# Patient Record
Sex: Male | Born: 1985 | Race: Black or African American | Hispanic: No | Marital: Married | Smoking: Never smoker
Health system: Southern US, Community
[De-identification: ages and names within clinical notes are randomized; demographics above are authoritative.]

## PROBLEM LIST (undated history)

## (undated) DIAGNOSIS — R569 Unspecified convulsions: Secondary | ICD-10-CM

## (undated) HISTORY — DX: Unspecified convulsions: R56.9

---

## 2019-02-04 ENCOUNTER — Other Ambulatory Visit: Payer: Self-pay

## 2019-02-04 ENCOUNTER — Emergency Department
Admission: EM | Admit: 2019-02-04 | Discharge: 2019-02-04 | Disposition: A | Discharge status: Home / Self Care | Payer: BC Managed Care – PPO | Attending: Emergency Medicine | Admitting: Emergency Medicine

## 2019-02-04 ENCOUNTER — Emergency Department: Payer: BC Managed Care – PPO

## 2019-02-04 ENCOUNTER — Encounter: Payer: Self-pay | Admitting: Emergency Medicine

## 2019-02-04 DIAGNOSIS — R569 Unspecified convulsions: Secondary | ICD-10-CM | POA: Diagnosis not present

## 2019-02-04 DIAGNOSIS — G919 Hydrocephalus, unspecified: Secondary | ICD-10-CM | POA: Insufficient documentation

## 2019-02-04 DIAGNOSIS — R404 Transient alteration of awareness: Secondary | ICD-10-CM | POA: Diagnosis present

## 2019-02-04 LAB — CBC
HCT: 43 % (ref 39.0–52.0)
Hemoglobin: 14.6 g/dL (ref 13.0–17.0)
MCH: 28.3 pg (ref 26.0–34.0)
MCHC: 34 g/dL (ref 30.0–36.0)
MCV: 83.5 fL (ref 80.0–100.0)
Platelets: 258 10*3/uL (ref 150–400)
RBC: 5.15 MIL/uL (ref 4.22–5.81)
RDW: 13 % (ref 11.5–15.5)
WBC: 9.6 10*3/uL (ref 4.0–10.5)
nRBC: 0 % (ref 0.0–0.2)

## 2019-02-04 LAB — BASIC METABOLIC PANEL
Anion gap: 8 (ref 5–15)
BUN: 13 mg/dL (ref 6–20)
CO2: 26 mmol/L (ref 22–32)
Calcium: 8.8 mg/dL — ABNORMAL LOW (ref 8.9–10.3)
Chloride: 105 mmol/L (ref 98–111)
Creatinine, Ser: 1.06 mg/dL (ref 0.61–1.24)
GFR calc Af Amer: >60 mL/min (ref >60)
GFR calc non Af Amer: >60 mL/min (ref >60)
Glucose, Bld: 167 mg/dL — ABNORMAL HIGH (ref 70–99)
Potassium: 2.9 mmol/L — ABNORMAL LOW (ref 3.5–5.1)
Sodium: 139 mmol/L (ref 135–145)

## 2019-02-04 LAB — TROPONIN I (HIGH SENSITIVITY): Troponin I (High Sensitivity): 10 ng/L (ref <18)

## 2019-02-04 MED ORDER — GADOBUTROL 1 MMOL/ML IV SOLN
10.0000 mL | Freq: Once | INTRAVENOUS | Status: AC | PRN
  Administered 2019-02-04: 07:00:00 10 mL via INTRAVENOUS

## 2019-02-04 MED ORDER — POTASSIUM CHLORIDE CRYS ER 20 MEQ PO TBCR
40.0000 meq | EXTENDED_RELEASE_TABLET | Freq: Once | ORAL | Status: AC
  Administered 2019-02-04: 08:00:00 40 meq via ORAL
  Filled 2019-02-04: qty 2

## 2019-02-04 MED ORDER — LEVETIRACETAM 500 MG PO TABS
500.0000 mg | ORAL_TABLET | Freq: Once | ORAL | Status: AC
  Administered 2019-02-04: 500 mg via ORAL
  Filled 2019-02-04: qty 1

## 2019-02-04 MED ORDER — LEVETIRACETAM 500 MG PO TABS: 500.0000 mg | ORAL_TABLET | Freq: Two times a day (BID) | ORAL | 1 refills | Status: DC

## 2019-02-04 NOTE — Consult Note (Signed)
Neurosurgery-New Consultation Evaluation 02/04/2019 Tracy Holmes [id number]  Identifying Statement: Tracy Holmes [place] 33 [place] [id number] with first seziure  Physician Requesting Consultation: [facility]  History of Present Illness: Mr. Tracy Holmes is here with likely seizure this morning. Per spouse, she noted he was dazed and staring with some blood on pillow from biting tongue. She saw some abnormal movements but denies shaking. He has returned to his baseline here and denies any history of seizure. He also denies any previous severe headaches, vision problems, speech problems. He has no history of neurologic disease and is otherwise healthy. He notes no changes this week that may incite a seizure. The ED got a CT and MRI of the head as part of the workup and have started him on an AED.   Past Medical History:  History reviewed. No pertinent past medical history.  Social History: Social History   Socioeconomic History  . Marital status: Married    Spouse name: Not on file  . Number of children: Not on file  . Years of education: Not on file  . Highest education level: Not on file  Occupational History  . Not on file  Social Needs  . Financial resource strain: Not on file  . Food insecurity    Worry: Not on file    Inability: Not on file  . Transportation needs    Medical: Not on file    Non-medical: Not on file  Tobacco Use  . Smoking status: Not on file  Substance and Sexual Activity  . Alcohol use: Not on file  . Drug use: Not on file  . Sexual activity: Not on file  Lifestyle  . Physical activity    Days per week: Not on file    Minutes per session: Not on file  . Stress: Not on file  Relationships  . Social Musicianconnections    Talks on phone: Not on file    Gets together: Not on file    Attends religious service: Not on file    Active member of club or organization: Not on file    Attends meetings of clubs or organizations: Not on file   Relationship status: Not on file  . Intimate partner violence    Fear of current or ex partner: Not on file    Emotionally abused: Not on file    Physically abused: Not on file    Forced sexual activity: Not on file  Other Topics Concern  . Not on file  Social History Narrative  . Not on file   Living arrangements (living alone, with partner): [place]  Family History: No family history on file.  Review of Systems:  Review of Systems - General ROS: Negative Psychological ROS: Negative Ophthalmic ROS: Negative ENT ROS: Negative Hematological and Lymphatic ROS: Negative  Endocrine ROS: Negative Respiratory ROS: Negative Cardiovascular ROS: Negative Gastrointestinal ROS: Negative Genito-Urinary ROS: Negative Musculoskeletal ROS: Negative Neurological ROS: Positive for seizure Dermatological ROS: Negative  Physical Exam: BP (!) 165/106 (BP Location: Right Arm)   Pulse 73   Resp 18   Ht 5\' 9"  (1.753 m)   Wt (!) 139.3 kg   SpO2 100%   BMI 45.34 kg/m  Body mass index is 45.34 kg/m. Body surface area is 2.6 meters squared. General appearance: Alert, cooperative, in no acute distress Head: Normocephalic, atraumatic Eyes: Normal, EOM intact Oropharynx: Moist without lesions Neck: Supple, Full ROM Ext: No edema in LE bilaterally  Neurologic exam:  Mental status: alertness: alert, orientation: person, place, time, affect: normal Speech: fluent and clear, naming and repetition intact Cranial nerves:  II: Visual fields are full by confrontation, no ptosis III/IV/VI: extra-ocular motions intact bilaterally V/VII:no evidence of facial droop or weakness  VIII: hearing normal XI: trapezius strength symmetric XII: tongue strength symmetric  Motor:strength symmetric 5/5, normal muscle mass and tone in all extremities and no pronator drift Sensory: intact to light touch in all extremities Gait: not tested  Laboratory: Results for orders placed or performed  during the hospital encounter of 02/04/19  Basic metabolic panel - if new onset seizures  Result Value Ref Range   Sodium 139 135 - 145 mmol/L   Potassium 2.9 (L) 3.5 - 5.1 mmol/L   Chloride 105 98 - 111 mmol/L   CO2 26 22 - 32 mmol/L   Glucose, Bld 167 (H) 70 - 99 mg/dL   BUN 13 6 - 20 mg/dL   Creatinine, Ser 8.14 0.61 - 1.24 mg/dL   Calcium 8.8 (L) 8.9 - 10.3 mg/dL   GFR calc non Af Amer >60 >60 mL/min   GFR calc Af Amer >60 >60 mL/min   Anion gap 8 5 - 15  CBC - if new onset seizures  Result Value Ref Range   WBC 9.6 4.0 - 10.5 K/uL   RBC 5.15 4.22 - 5.81 MIL/uL   Hemoglobin 14.6 13.0 - 17.0 g/dL   HCT 48.1 85.6 - 31.4 %   MCV 83.5 80.0 - 100.0 fL   MCH 28.3 26.0 - 34.0 pg   MCHC 34.0 30.0 - 36.0 g/dL   RDW 97.0 26.3 - 78.5 %   Platelets 258 150 - 400 K/uL   nRBC 0.0 0.0 - 0.2 %  Troponin I (High Sensitivity)  Result Value Ref Range   Troponin I (High Sensitivity) 10 <18 ng/L   I personally reviewed labs  Imaging: Results for orders placed during the hospital encounter of 02/04/19  MR Brain W and Wo Contrast   Narrative CLINICAL DATA:  Seizure, new, nontraumatic  EXAM: MRI HEAD WITHOUT AND WITH CONTRAST  TECHNIQUE: Multiplanar, multiecho pulse sequences of the brain and surrounding structures were obtained without and with intravenous contrast.  CONTRAST:  41mL GADAVIST GADOBUTROL 1 MMOL/ML IV SOLN  COMPARISON:  None.  FINDINGS: Brain: Lateral ventriculomegaly with ballooning. Third ventricular enlargement with dilated inferior recesses that project downward. The fourth ventricle is normal caliber and there is a highly stenotic appearance of the lower cerebral aqua duct, imperceptible on a few axial thin T1 slices. No underlying mass or swelling is noted. No signs of previous intracranial hemorrhage or infection. Partially empty sella appearance with a few internal thin septations, of indeterminate significance in presumably from altered CSF flow. There is  trace periventricular FLAIR hyperintensity around the lateral ventricles presumably reactive to the hydrocephalus. No focal cortical finding  Vascular: Normal flow voids and vascular enhancements.  Skull and upper cervical spine: Normal marrow signal  Sinuses/Orbits: Negative  IMPRESSION: 1. 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 2. History of seizure with no focal cortical finding.   Electronically Signed   By: Marnee Spring M.D.   On: 02/04/2019 06:56    I personally reviewed radiology studies with the patient   Impression/Plan:  Mr. 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 I do not see an obvious correlation to his seizure and given that he is  asymptomatic at this time, we discussed options of close follow up vs consideration of CSF diversion. I dont see an urgent role for intervention and we discussed that this could have been there since birth. I do think we will need follow up MRI in time to ensure there is no underlying lesion but T2 and contrasted images today show no offending lesion causing any compression. He agrees with plan for close follow up.    1.  Diagnosis: Hydrocephalus  2.  Plan - Follow up in clinic in 3-4 weeks.

## 2019-02-04 NOTE — ED Triage Notes (Signed)
Pt's spouse states she woke up to pt possibly having a seizure. Pt did bite his tongue, spouse states pt was face down in pillow, "gasping" for air and jerking per spouse. Incident occurred around 0300. Pt states all he remembers is he woke up to paramedics at bedside.

## 2019-02-04 NOTE — ED Notes (Signed)
Report tom, rn. siderails up, spouse at bedside.

## 2019-02-04 NOTE — ED Provider Notes (Signed)
Assumed care from Dr. Charna Archer at 7 AM. Briefly, the patient is a 33 y.o. male with PMHx of  has no past medical history on file. here with new onset seizure. 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 NSGY consulted, unlikely etiology for seizures but will see pt this AM in ED. Plan to d/w Neuro re: meds, f/u, and likely discharge.   Labs Reviewed  BASIC METABOLIC PANEL - Abnormal; Notable for the following components:      Result Value   Potassium 2.9 (*)    Glucose, Bld 167 (*)    Calcium 8.8 (*)    All other components within normal limits  CBC  TROPONIN I (HIGH SENSITIVITY)    Course of Care: -Discussed with Dr. Doy Mince. Will start on Keppra 500 BID and refer for outpt EEG. First dose given here. NSGY will see in ED and f/u as outpatient. Pt updated, in agreement. No seizure like activity here.     Duffy Bruce, MD 02/04/19 0930

## 2019-02-04 NOTE — ED Provider Notes (Signed)
[facility] Emergency Department Provider Note   ____________________________________________   First MD Initiated Contact with Patient 02/04/19 [phone]     (approximate)  I have reviewed the triage vital signs and the nursing notes.   HISTORY  Chief Complaint possible seizure    HPI Tracy Holmes is a 33 y.o. male with no significant past medical history who presents to the ED following possible seizure.  Majority of history of is obtained from wife, who states that she was woken from sleep to find the patient face down and actively shaking in bed.  She states that he was unresponsive and continued shaking for 1 to 2 minutes.  Afterwards, he opened his eyes but would only stare blankly at her and seemed very confused.  He remained nonverbal until the point where EMS arrived.  Patient states he does not remember anything happening, but woke up to EMS being present.  He does not have any history of seizures and denies any cardiac history.  He has been feeling well recently with no fevers, chills, cough, chest pain, shortness of breath.  He does not take any regular medications and denies any alcohol or drug abuse.  He currently feels well with no complaints.        History reviewed. No pertinent past medical history.  There are no active problems to display for this patient.   History reviewed. No pertinent surgical history.  Prior to Admission medications   Not on File    Allergies Patient has no known allergies.  No family history on file.  Social History Social History   Tobacco Use  . Smoking status: Not on file  Substance Use Topics  . Alcohol use: Not on file  . Drug use: Not on file    Review of Systems  Constitutional: No fever/chills Eyes: No visual changes. ENT: No sore throat. Cardiovascular: Denies chest pain. Respiratory: Denies shortness of breath. Gastrointestinal: No abdominal pain.  No nausea, no vomiting.  No diarrhea.   No constipation. Genitourinary: Negative for dysuria. Musculoskeletal: Negative for back pain. Skin: Negative for rash. Neurological: Negative for headaches, focal weakness or numbness.  Positive for possible seizure.  ____________________________________________   PHYSICAL EXAM:  VITAL SIGNS: ED Triage Vitals  Enc Vitals Group     BP 02/04/19 0400 (!) 160/91     Pulse Rate 02/04/19 0400 (!) 184     Resp 02/04/19 0403 18     Temp --      Temp Source 02/04/19 0403 Oral     SpO2 02/04/19 0400 98 %     Weight 02/04/19 0404 (!) 307 lb (139.3 kg)     Height 02/04/19 0404 5\' 9"  (1.753 m)     Head Circumference --      Peak Flow --      Pain Score 02/04/19 0404 0     Pain Loc --      Pain Edu? --      Excl. in GC? --     Constitutional: Alert and oriented. Eyes: Conjunctivae are normal.  Pupils equal round and reactive to light bilaterally, extra ocular movements intact with no nystagmus. Head: Atraumatic. Nose: No congestion/rhinnorhea. Mouth/Throat: Mucous membranes are moist.  Small right lateral tongue laceration. Neck: Normal ROM Cardiovascular: Normal rate, regular rhythm. Grossly normal heart sounds. Respiratory: Normal respiratory effort.  No retractions. Lungs CTAB. Gastrointestinal: Soft and nontender. No distention. Genitourinary: deferred Musculoskeletal: No lower extremity tenderness nor edema. Neurologic:  Normal speech and language. No  gross focal neurologic deficits are appreciated.  5 out of 5 strength in bilateral upper and lower extremities, no pronator drift. Skin:  Skin is warm, dry and intact. No rash noted. Psychiatric: Mood and affect are normal. Speech and behavior are normal.  ____________________________________________   LABS (all labs ordered are listed, but only abnormal results are displayed)  Labs Reviewed  BASIC METABOLIC PANEL - Abnormal; Notable for the following components:      Result Value   Potassium 2.9 (*)    Glucose, Bld 167 (*)     Calcium 8.8 (*)    All other components within normal limits  CBC  TROPONIN I (HIGH SENSITIVITY)  TROPONIN I (HIGH SENSITIVITY)   ____________________________________________  EKG  ED ECG REPORT I, Blake Divine, the attending physician, personally viewed and interpreted this ECG.   Date: 02/04/2019  EKG Time: 5:13  Rate: 96  Rhythm: normal sinus rhythm  Axis: Normal  Intervals:none  ST&T Change: Nonspecific T wave changes inferiorly    PROCEDURES  Procedure(s) performed (including Critical Care):  Procedures   ____________________________________________   INITIAL IMPRESSION / ASSESSMENT AND PLAN / ED COURSE       33 year old male episode where wife found him face down and shaking in bed seizure appears to be the most likely explanation for this episode, especially given the described postictal state as well as small tongue laceration.  Will check EKG and troponin, but overall low suspicion for cardiac etiology.  Patient denies any alcohol or drug abuse that would contribute to seizure activity, does not take any medications.  Will screen head CT, CBC, BMP.  Otherwise plan on holding off on anticonvulsants, will need to follow-up with neurology.  CT head significant for ventriculomegaly above the level of the cerebral aqueduct.  Will obtain MRI brain with and without contrast to further assess for CSF obstruction.  Patient denies any history of chronic headaches or other neurologic symptoms.  MRI brain with and without contrast shows stenosis at cerebral aqueduct.  Case was discussed with Dr. Lacinda Axon of neurosurgery, who will plan to evaluate the patient this morning however hydrocephalus thought to be unlikely to be contributing to patient's seizure-like episode.      ____________________________________________   FINAL CLINICAL IMPRESSION(S) / ED DIAGNOSES  Final diagnoses:  Seizure ([place])  Hydrocephalus, unspecified type [facility])     ED Discharge Orders     None       Note:  This document was prepared using Dragon voice recognition software and may include unintentional dictation errors.   Blake Divine, MD 02/04/19 [phone]

## 2019-02-04 NOTE — ED Notes (Signed)
Pt verbalized understanding of discharge instructions. NAD at this time. 

## 2019-02-04 NOTE — ED Notes (Signed)
184 hr entered in error.

## 2019-02-04 NOTE — Discharge Instructions (Addendum)
Start taking the Keppra twice a day  You have been seen in the emergency department today for a likely seizure.  Your workup today including labs are within normal limits.  Please follow up with your doctor as soon as possible regarding today's emergency department visit and your likely seizure.  You will also need to follow up with a neurologist as soon as possible, please call for appointment.  If you have been prescribed a medication for your seizures, please take this medication as prescribed.  As we have discussed it is very important that you DO NOT drive until you have been seen and cleared by your neurologist.  Please drink plenty of fluids, get plenty of sleep and avoid any alcohol or drug use.  Return to the emergency department if you have any further seizures, develop any weakness/numbness of any arm/leg, confusion, slurred speech, or sudden/severe headache.

## 2019-02-04 NOTE — ED Notes (Signed)
Blue, Red, LT Si [place]ider, and [place] sent to lab.

## 2019-02-28 ENCOUNTER — Encounter: Payer: Self-pay | Admitting: Neurology

## 2019-02-28 ENCOUNTER — Other Ambulatory Visit: Payer: Self-pay

## 2019-02-28 ENCOUNTER — Telehealth (INDEPENDENT_AMBULATORY_CARE_PROVIDER_SITE_OTHER): Payer: BC Managed Care – PPO | Admitting: Neurology

## 2019-02-28 VITALS — Ht 69.0 in | Wt 300.0 lb

## 2019-02-28 DIAGNOSIS — Q030 Malformations of aqueduct of Sylvius: Secondary | ICD-10-CM

## 2019-02-28 DIAGNOSIS — R569 Unspecified convulsions: Secondary | ICD-10-CM | POA: Diagnosis not present

## 2019-02-28 DIAGNOSIS — G40909 Epilepsy, unspecified, not intractable, without status epilepticus: Secondary | ICD-10-CM | POA: Insufficient documentation

## 2019-02-28 MED ORDER — LEVETIRACETAM 500 MG PO TABS: 500.0000 mg | ORAL_TABLET | Freq: Two times a day (BID) | ORAL | 11 refills | Status: DC

## 2019-02-28 NOTE — Progress Notes (Signed)
Virtual Visit via Video Note The purpose of this virtual visit is to provide medical care while limiting exposure to the novel coronavirus.    Consent was obtained for video visit:  Yes.   Answered questions that patient had about telehealth interaction:  Yes.   I discussed the limitations, risks, security and privacy concerns of performing an evaluation and management service by telemedicine. I also discussed with the patient that there may be a patient responsible charge related to this service. The patient expressed understanding and agreed to proceed.  Pt location: Home Physician Location: office Name of referring provider:  Blake Divine, MD I connected with Tracy Holmes at patients initiation/request on 02/28/2019 at  9:00 AM EST by video enabled telemedicine application and verified that I am speaking with the correct person using two identifiers. Pt MRN:  [id number] Pt DOB:  19-May-1985 Video Participants:  Tracy Holmes   History of Present Illness:  This is a very pleasant 33 year old right-handed man with no significant past medical history presenting for evaluation of seizure-like activity last 02/04/2019. He is amnestic of the event, waking up to EMS in his house. His right arm felt sore but no focal weakness. He reports that his wife woke up because she heard him gasping for air. He was face down on the pillow, he was not responding, so she moved his head and found him with eyes open with a blank stare, repeatedly gasping for air. She was not sure if there was body jerking, she had reported shaking to EMS/ER, but husband states she is now unsure. He bit the right side of his tongue, no incontinence. He was brought to [facility] where bloodwork showed a K of 2.9, otherwise unremarkable. I personally reviewed MRI brain with and without contrast which did not show any acute changes. There was lateral ventriculomegaly with ballooning, third ventricular enlargement with dilated inferior recesses  projecting downward, highly stenotic appearance of lower cerebral aqueduct. No underlying mass. 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 There was trace periventricular FLAIR signal around the lateral ventricles presumably reactive to the hydrocephalus. Hippocampi symmetric with no abnormal cortical changes seen. 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 Due to abnormal brain scan, he was started on Levetiracetam 500mg  BID. He denies any side effects. He denies any prior history of seizures. He denies any staring/unresponsive episodes, gaps in time, olfactory/gustatory hallucinations, deja vu, rising epigastric sensation, focal numbness/tingling/weakness, myoclonic jerks. He denies any headaches, dizziness, vision changes, neck pain, bowel/bladder dysfunction. He has scoliosis with a pinched nerve and back pain, which occurred a week prior to the seizure. He does not drink alcohol. He does not feel under stress, but has had a lot going on, waking up a few times with his mind racing. He was recently married with a new house and a baby on the way in June. 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  Epilepsy Risk Factors: His father had 1 or 2 seizures at age 78. Otherwise he had a normal birth and early development.  There is no history of febrile convulsions, CNS infections such as meningitis/encephalitis, significant traumatic brain injury, neurosurgical procedures.   PAST MEDICAL HISTORY: Past Medical History:  Diagnosis Date  . Seizure ([place])     PAST SURGICAL HISTORY: No past surgical history on file.  MEDICATIONS: Current Outpatient Medications on File Prior to Visit  Medication Sig Dispense Refill  . levETIRAcetam (KEPPRA) 500 MG tablet Take  1 tablet (500 mg total) by mouth 2 (two) times daily. 60 tablet 1   No current facility-administered medications on file prior to visit.     ALLERGIES: No  Known Allergies  FAMILY HISTORY: Family History  Problem Relation Age of Onset  . Diabetes Mother   . Heart disease Father   . Breast cancer Maternal Grandmother       Current Outpatient Medications on File Prior to Visit  Medication Sig Dispense Refill  . levETIRAcetam (KEPPRA) 500 MG tablet Take 1 tablet (500 mg total) by mouth 2 (two) times daily. 60 tablet 1   No current facility-administered medications on file prior to visit.      Observations/Objective:   Vitals:   02/28/19 0828  Weight: 300 lb (136.1 kg)  Height: 5\' 9"  (1.753 m)   GEN:  The patient appears stated age and is in NAD.  Neurological examination: Patient is awake, alert, oriented x 3. No aphasia or dysarthria. Intact fluency and comprehension. Remote and recent memory intact. Able to name and repeat. Cranial nerves: Extraocular movements intact with no nystagmus. No facial asymmetry. Motor: moves all extremities symmetrically, at least anti-gravity x 4. No incoordination on finger to nose testing. Gait: narrow-based and steady, able to tandem walk adequately. Negative Romberg test.  Assessment and Plan:   This is a pleasant 33 year old right-handed man with no significant past medical history presenting for evaluation of an episode last 02/04/2019 concerning for a nocturnal seizure. 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 Seizures have been described in the setting of hydrocephalus with aqueductal stenosis. A 1-hour EEG will be ordered for seizure classification. We discussed continuation of Levetiracetam 500mg  BID, refills sent. We discussed avoidance of seizure triggers, seizures restrictions, including no driving until 6 months seizure-free. He will follow-up in 3 months and knows to call for any changes.    Follow Up Instructions:   -I discussed the assessment and treatment plan with the patient. The patient was provided an  opportunity to ask questions and all were answered. The patient agreed with the plan and demonstrated an understanding of the instructions.   The patient was advised to call back or seek an in-person evaluation if the symptoms worsen or if the condition fails to improve as anticipated.   02/06/2019, MD

## 2019-03-15 ENCOUNTER — Other Ambulatory Visit: Payer: BC Managed Care – PPO

## 2019-05-20 ENCOUNTER — Encounter: Payer: Self-pay | Admitting: Neurology

## 2019-05-20 ENCOUNTER — Telehealth (INDEPENDENT_AMBULATORY_CARE_PROVIDER_SITE_OTHER): Payer: BC Managed Care – PPO | Admitting: Neurology

## 2019-05-20 ENCOUNTER — Other Ambulatory Visit: Payer: Self-pay

## 2019-05-20 VITALS — Ht 70.0 in | Wt 307.0 lb

## 2019-05-20 DIAGNOSIS — R569 Unspecified convulsions: Secondary | ICD-10-CM

## 2019-05-20 DIAGNOSIS — Q030 Malformations of aqueduct of Sylvius: Secondary | ICD-10-CM

## 2019-05-20 NOTE — Progress Notes (Signed)
Virtual Visit via Video Note The purpose of this virtual visit is to provide medical care while limiting exposure to the novel coronavirus.    Consent was obtained for video visit:  Yes.   Answered questions that patient had about telehealth interaction:  Yes.   I discussed the limitations, risks, security and privacy concerns of performing an evaluation and management service by telemedicine. I also discussed with the patient that there may be a patient responsible charge related to this service. The patient expressed understanding and agreed to proceed.  Pt location: Home Physician Location: office Name of referring provider:  No ref. provider found I connected with Tracy Holmes at patients initiation/request on 05/20/2019 at 11:00 AM EST by video enabled telemedicine application and verified that I am speaking with the correct person using two identifiers. Pt MRN:  [id number] Pt DOB:  04-01-1986 Video Participants:  Tracy Holmes   History of Present Illness:  The patient was seen as a virtual video visit on 05/20/2019. He was last seen 3 months ago for new onset nocturnal seizure that occurred on 02/04/2019. MRI brain did not show any acute changes. There was lateral ventriculomegaly with ballooning, third ventricular enlargement with dilated inferior recesses projecting downward, highly stenotic appearance of lower cerebral aqueduct. No underlying mass. 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 There was trace periventricular FLAIR signal around the lateral ventricles presumably reactive to the hydrocephalus. Hippocampi symmetric with no abnormal cortical changes seen. 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 He has not yet done the EEG. He was discharged on Levetiracetam 500mg  BID which he is taking without side effects, no further seizures since 01/2019. He denies any staring/unresponsive episodes, gaps in time, olfactory/gustatory  hallucinations, focal numbness/tingling/weakness, myoclonic jerks. No significant headaches, he had a tension headache yesterday that resolved with sinus medication. No dizziness, vision changes, no falls. Sleep and mood are good. His son's due date is on June 3.   History on Initial Assessment 02/28/2019: This is a very pleasant 34 year old right-handed man with no significant past medical history presenting for evaluation of seizure-like activity last 02/04/2019. He is amnestic of the event, waking up to EMS in his house. His right arm felt sore but no focal weakness. He reports that his wife woke up because she heard him gasping for air. He was face down on the pillow, he was not responding, so she moved his head and found him with eyes open with a blank stare, repeatedly gasping for air. She was not sure if there was body jerking, she had reported shaking to EMS/ER, but husband states she is now unsure. He bit the right side of his tongue, no incontinence. He was brought to [facility] where bloodwork showed a K of 2.9, otherwise unremarkable. I personally reviewed MRI brain with and without contrast which did not show any acute changes. There was lateral ventriculomegaly with ballooning, third ventricular enlargement with dilated inferior recesses projecting downward, highly stenotic appearance of lower cerebral aqueduct. No underlying mass. 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 There was trace periventricular FLAIR signal around the lateral ventricles presumably reactive to the hydrocephalus. Hippocampi symmetric with no abnormal cortical changes seen. 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 Due to abnormal brain scan, he was started on Levetiracetam 500mg  BID. He denies any side effects. He denies any prior history of seizures. He denies any staring/unresponsive episodes, gaps in  time, olfactory/gustatory  hallucinations, deja vu, rising epigastric sensation, focal numbness/tingling/weakness, myoclonic jerks. He denies any headaches, dizziness, vision changes, neck pain, bowel/bladder dysfunction. He has scoliosis with a pinched nerve and back pain, which occurred a week prior to the seizure. He does not drink alcohol. He does not feel under stress, but has had a lot going on, waking up a few times with his mind racing. He was recently married with a new house and a baby on the way in June. 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  Epilepsy Risk Factors: His father had 1 or 2 seizures at age 100. Otherwise he had a normal birth and early development.  There is no history of febrile convulsions, CNS infections such as meningitis/encephalitis, significant traumatic brain injury, neurosurgical procedures.     Current Outpatient Medications on File Prior to Visit  Medication Sig Dispense Refill  . levETIRAcetam (KEPPRA) 500 MG tablet Take 1 tablet (500 mg total) by mouth 2 (two) times daily. 60 tablet 11   No current facility-administered medications on file prior to visit.     Observations/Objective:   Vitals:   05/20/19 1025  Weight: (!) 307 lb (139.3 kg)  Height: 5\' 10"  (1.778 m)   GEN:  The patient appears stated age and is in NAD.  Neurological examination: Patient is awake, alert, oriented x 3. No aphasia or dysarthria. Intact fluency and comprehension. Remote and recent memory intact. Able to name and repeat. Cranial nerves: Extraocular movements intact with no nystagmus. No facial asymmetry. Motor: moves all extremities symmetrically, at least anti-gravity x 4. No incoordination on finger to nose testing. Gait: narrow-based and steady, able to tandem walk adequately. Negative   Assessment and Plan:   This is a pleasant 34 yo RH man with no significant past medical history, who had an episode last 02/04/2019 concerning for a nocturnal seizure. 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 Seizures have been described in the setting of hydrocephalus with aqueductal stenosis. Proceed with 1-hour EEG as discussed. We again discussed continuation of Levetiracetam 500mg  BID. He is aware of [place] driving laws to stop driving until 6 months seizure-free. Follow-up in 5-6 months, he knows to call for any changes.   Follow Up Instructions:   -I discussed the assessment and treatment plan with the patient. The patient was provided an opportunity to ask questions and all were answered. The patient agreed with the plan and demonstrated an understanding of the instructions.   The patient was advised to call back or seek an in-person evaluation if the symptoms worsen or if the condition fails to improve as anticipated.     Cameron Sprang, MD

## 2019-10-18 ENCOUNTER — Ambulatory Visit: Payer: BC Managed Care – PPO | Admitting: Neurology

## 2020-03-27 ENCOUNTER — Other Ambulatory Visit: Payer: Self-pay | Admitting: Neurology

## 2020-03-27 DIAGNOSIS — R569 Unspecified convulsions: Secondary | ICD-10-CM

## 2020-05-03 ENCOUNTER — Other Ambulatory Visit: Payer: Self-pay | Admitting: Neurology

## 2020-05-03 DIAGNOSIS — R569 Unspecified convulsions: Secondary | ICD-10-CM

## 2020-05-07 ENCOUNTER — Telehealth: Payer: Self-pay | Admitting: Neurology

## 2020-05-07 DIAGNOSIS — R569 Unspecified convulsions: Secondary | ICD-10-CM

## 2020-05-07 NOTE — Telephone Encounter (Signed)
Spoke with Tracy Holmes informed him to start taking Keppra twice a day as prescribed. Also, him and Dr Karel Jarvis had talked about doing an EEG, I will get the front to call and have him schedule eeg and a f/u with Dr Karel Jarvis

## 2020-05-07 NOTE — Telephone Encounter (Signed)
Pt c/o: seizure Missed medications?  Yes.   only taken it once a day not twice  Sleep deprived?  No. Alcohol intake?  No. Back to their usual baseline self?  Yes.  . If no, advise go to ER Any increase stress? No increase in stress, stated has had less stress Current medications prescribed by Dr. Karel Jarvis: Keppra 500 mg has only been taken once a day stated going to start taken it back BID  Has a pinch nerve in his back, has a cream that he puts on his back unsure of the name,

## 2020-05-07 NOTE — Telephone Encounter (Signed)
Agree, he will need to start taking it twice a day as prescribed. Also, we had talked about doing an EEG, pls have him schedule it (and re-order if needed). Pls have him schedule a f/u with me, thanks

## 2020-05-07 NOTE — Addendum Note (Signed)
Addended by: Dimas Chyle on: 05/07/2020 01:16 PM   Modules accepted: Orders

## 2020-06-12 ENCOUNTER — Other Ambulatory Visit: Payer: Self-pay | Admitting: Neurology

## 2020-06-12 DIAGNOSIS — R569 Unspecified convulsions: Secondary | ICD-10-CM

## 2020-06-19 ENCOUNTER — Telehealth: Payer: Self-pay | Admitting: Neurology

## 2020-06-19 DIAGNOSIS — R569 Unspecified convulsions: Secondary | ICD-10-CM

## 2020-06-19 MED ORDER — LEVETIRACETAM 500 MG PO TABS: 500.0000 mg | ORAL_TABLET | Freq: Two times a day (BID) | ORAL | 3 refills | Status: DC

## 2020-06-19 NOTE — Telephone Encounter (Signed)
Rx(s) sent to pharmacy electronically.  Left message informing patient that his rx has been sent to the pharmacy. Advised a call back with any questions or concerns.

## 2020-06-19 NOTE — Telephone Encounter (Signed)
Patient called in stating he needs a refill on his levetiracetam. His next scheduled appointment is 02/26/21.

## 2020-07-07 IMAGING — CT CT HEAD W/O CM
3 of 4 series · 15 of 47 positions shown, 18 images · non-contrast
Comparison: None.

CLINICAL DATA: Seizure, new, nontraumatic

EXAM:
CT HEAD WITHOUT CONTRAST
TECHNIQUE: Contiguous axial images were obtained from the base of the skull
through the vertex without intravenous contrast.

[Series 2: head wo · axial · 0.45mm/px · z∈[-117,+8]mm · 9 of 31 slices shown, 12 images]
[im 3/31  brain]
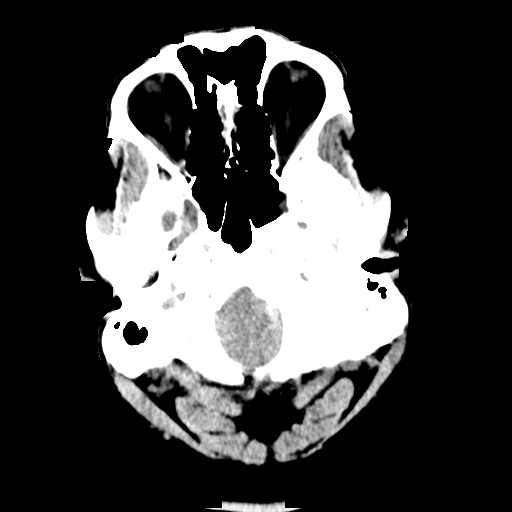
[im 3/31  bone]
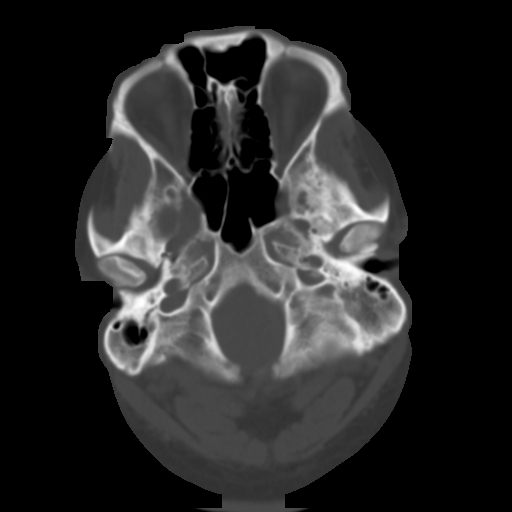
[im 7/31  brain]
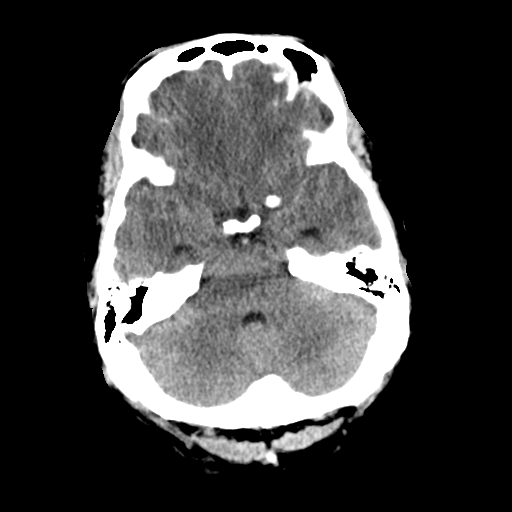
[im 9/31  brain]
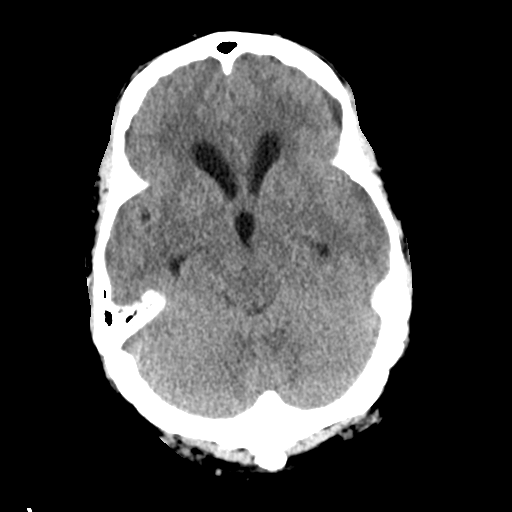
[im 13/31  brain]
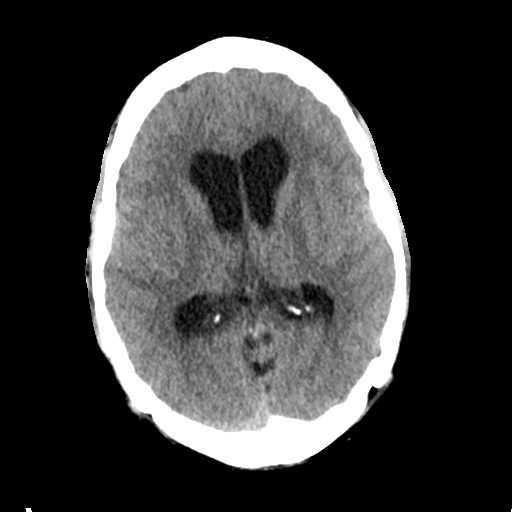
[im 16/31  brain]
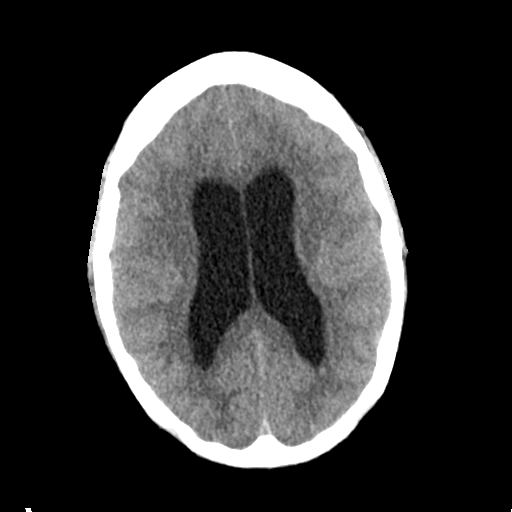
[im 16/31  bone]
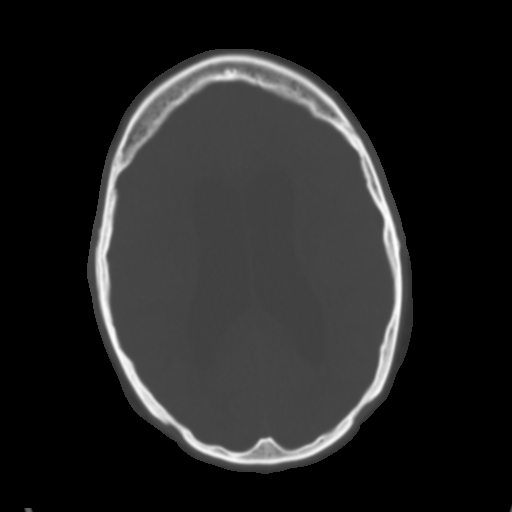
[im 18/31  brain]
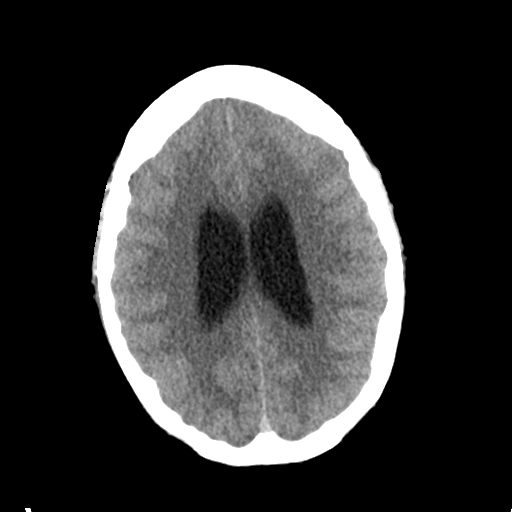
[im 22/31  brain]
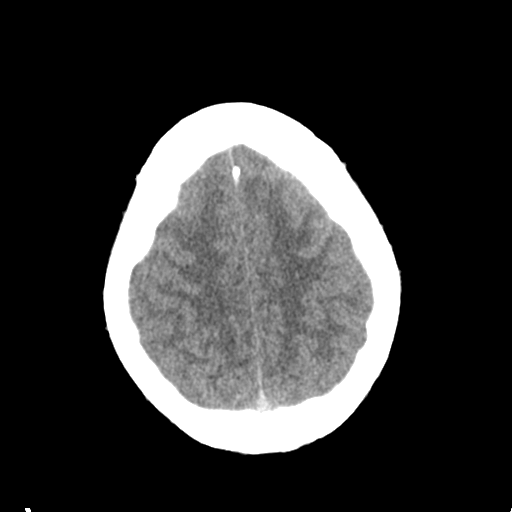
[im 24/31  brain]
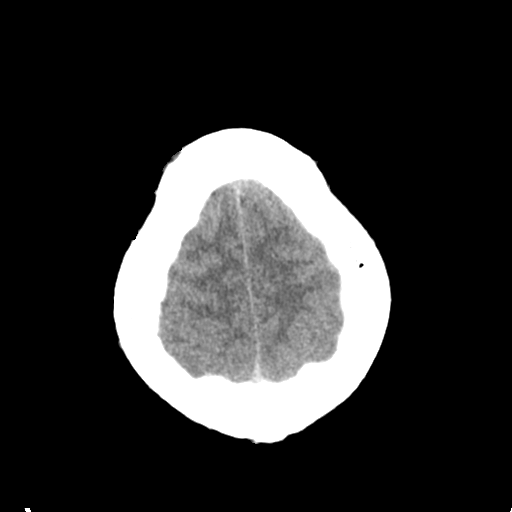
[im 28/31  brain]
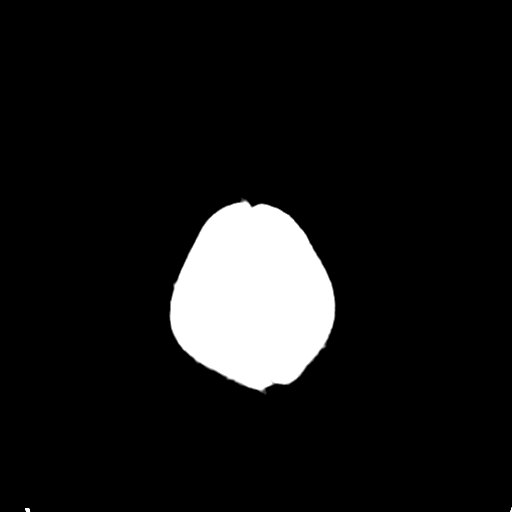
[im 28/31  bone]
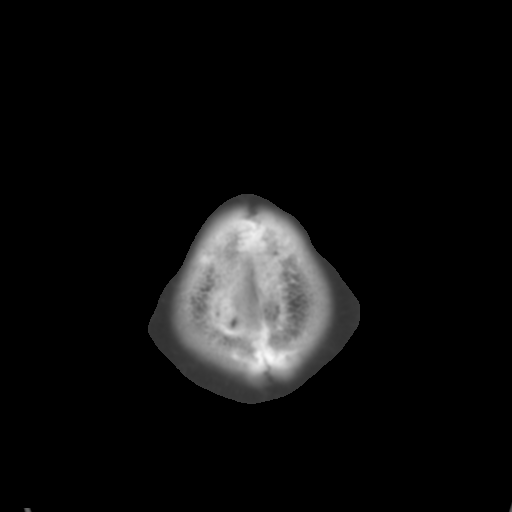

[Series 5: coronal soft tissue · coronal · 0.31mm/px · 3 of 68 slices shown]
[im 23/68  brain]
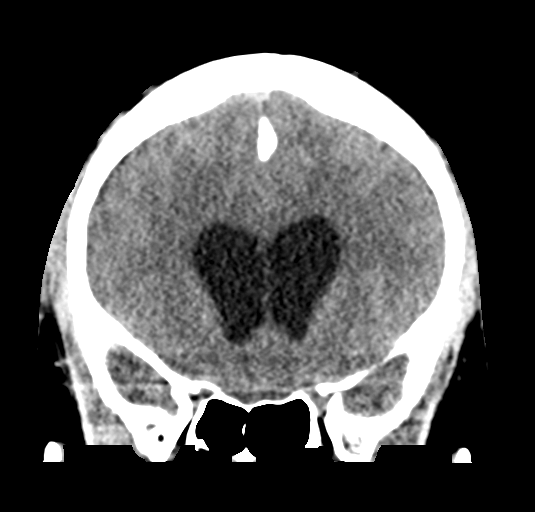
[im 30/68  brain]
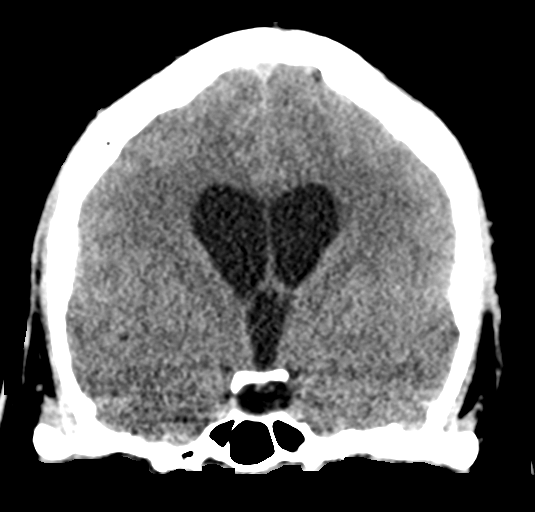
[im 38/68  brain]
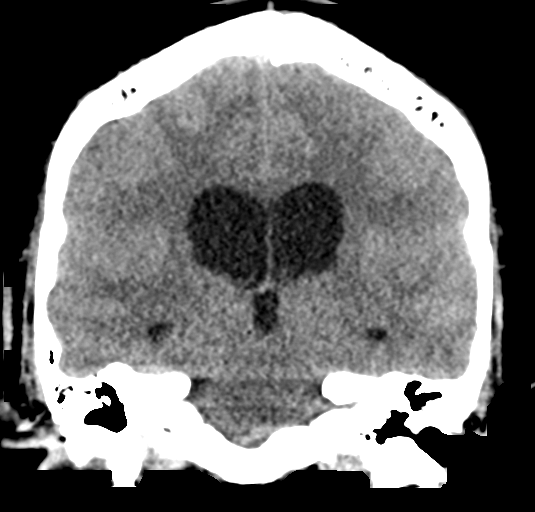

[Series 6: sagittal soft tissue · sagittal · 0.29mm/px · 3 of 51 slices shown]
[im 17/51  brain]
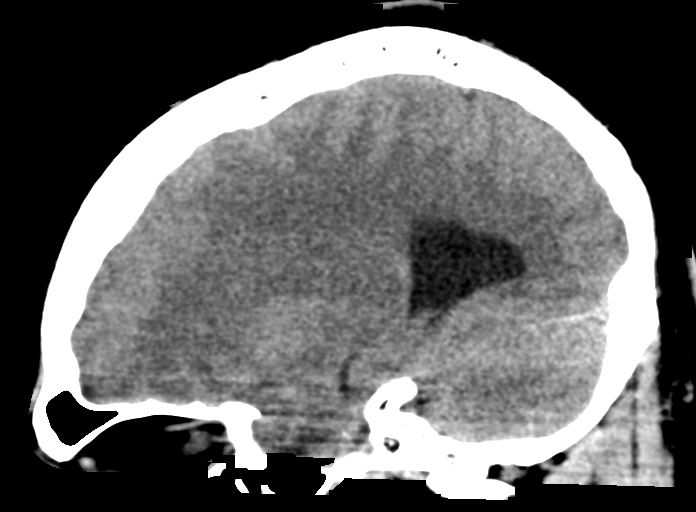
[im 26/51  brain]
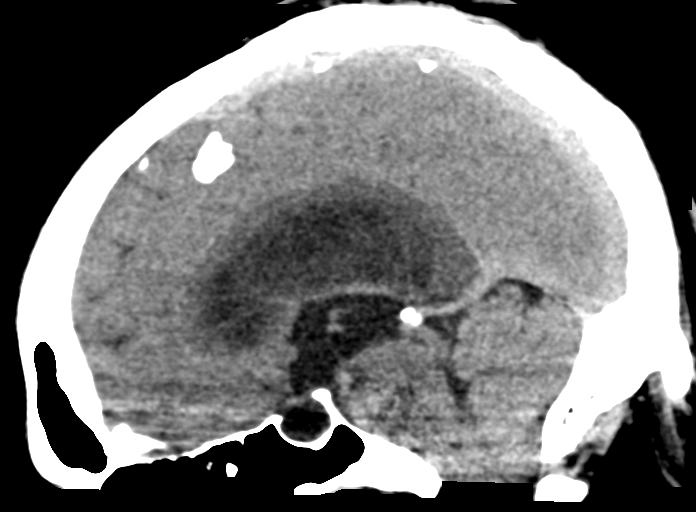
[im 34/51  brain]
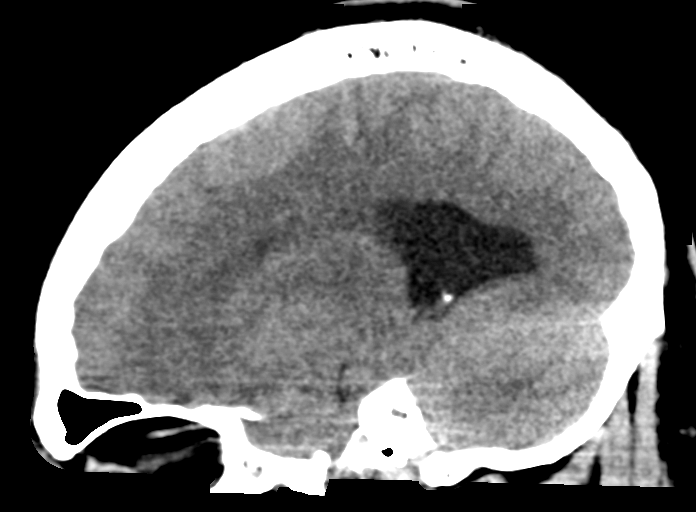

[15 of 47 positions shown; findings below may reference images not displayed]

FINDINGS: Brain: Enlarged lateral ventricles with ballooning although only
mild temporal horn dilatation. The third ventricle is also enlarged
with depression of inferior recesses on sagittal reformats. Fourth
ventricle is normal size. No visible obstructive process seen at the
level of the brainstem. Low cerebellar tonsils by 3 mm with no
pointing typical of Chiari malformation. No evidence of underlying
infarct, hemorrhage, mass, or extra-axial collection. No evidence of
transependymal CSF flow. Reportedly patient has no baseline
headaches and findings could be developmental and compensated.

Vascular: Negative

Skull: Negative

Sinuses/Orbits: Negative

These results were called by telephone at the time of interpretation
on 02/04/2019 at [DATE] to provider JEOFREY PETER DECLERK , who verbally
acknowledged these results.
IMPRESSION: 1. Ventriculomegaly above the cerebral aquaduct, recommend brain MRI
with contrast.
2. No cortical finding to explain seizure.

## 2020-07-16 ENCOUNTER — Telehealth: Payer: Self-pay | Admitting: Neurology

## 2020-07-16 NOTE — Telephone Encounter (Signed)
Pt wife just called and states that had a seizure today, he is taking his medication like he should they need to know what you would like to do at this point  Please call

## 2020-07-16 NOTE — Telephone Encounter (Signed)
Pt c/o: seizure Missed medications?  no Sleep deprived?  yes Alcohol intake?  no Back to their usual baseline self?  yes Current medications prescribed by Dr. Doneen Poisson 500mg  bid

## 2020-07-17 ENCOUNTER — Other Ambulatory Visit: Payer: Self-pay

## 2020-07-17 MED ORDER — LEVETIRACETAM 750 MG PO TABS: 750.0000 mg | ORAL_TABLET | Freq: Two times a day (BID) | ORAL | 3 refills | Status: DC

## 2020-07-17 NOTE — Telephone Encounter (Signed)
No answer at 11:10am 07/17/2020

## 2020-07-17 NOTE — Telephone Encounter (Signed)
Pls have them increase Keppra 500mg : Take 1 and 1/2 tablets twice a day. Pls send in Rx. Will put him on waitlist for earlier appt, thanks

## 2020-07-17 NOTE — Telephone Encounter (Signed)
Sent order to [facility], to sign and patient advised.

## 2020-08-31 ENCOUNTER — Other Ambulatory Visit: Payer: Self-pay

## 2020-08-31 ENCOUNTER — Telehealth: Payer: Self-pay | Admitting: Neurology

## 2020-08-31 DIAGNOSIS — R569 Unspecified convulsions: Secondary | ICD-10-CM

## 2020-08-31 NOTE — Telephone Encounter (Signed)
Pt called no answer left a voice mail that we want him to have an eeg to call the office so we can get him scheduled number to the office was left for him to call back.

## 2020-08-31 NOTE — Telephone Encounter (Signed)
Patient called to report he had another seizure this morning. He said he is okay.   He wanted Dr. Karel Jarvis to know he is interested in doing a sleep test.   Patient added to wait list and scheduled for 01/05/21.

## 2020-08-31 NOTE — Telephone Encounter (Signed)
Pls let him know that I would like to do a 1-hour EEG, we have been trying to get this scheduled for him, as well as been calling him for an earlier appt since he contacted our office the last time. Pls have him do the EEG and we will put him on waitlist to be seen after EEG, thanks

## 2020-08-31 NOTE — Telephone Encounter (Signed)
Pt c/o: seizure Missed medications?  No. Sleep deprived?  No. Alcohol intake?  No.  Has had some increase in stress form having seizures/ Back to their usual baseline self?  Yes.  . If no, advise go to ER Current medications prescribed by Dr. Karel Jarvis:  Keppra 750mg  2 (two) times daily  No trigger noted. Pt would like to do an EEG not a sleep study  Pt advised that we would call him back to let him know what kind of EEG would be ordered

## 2020-09-10 ENCOUNTER — Ambulatory Visit (INDEPENDENT_AMBULATORY_CARE_PROVIDER_SITE_OTHER): Payer: BC Managed Care – PPO | Admitting: Neurology

## 2020-09-10 ENCOUNTER — Other Ambulatory Visit: Payer: Self-pay

## 2020-09-10 DIAGNOSIS — R569 Unspecified convulsions: Secondary | ICD-10-CM

## 2020-09-18 ENCOUNTER — Ambulatory Visit (INDEPENDENT_AMBULATORY_CARE_PROVIDER_SITE_OTHER): Payer: BC Managed Care – PPO | Admitting: Neurology

## 2020-09-18 ENCOUNTER — Encounter: Payer: Self-pay | Admitting: Neurology

## 2020-09-18 ENCOUNTER — Other Ambulatory Visit: Payer: Self-pay

## 2020-09-18 VITALS — BP 171/13 | HR 73 | Ht 70.0 in | Wt 310.8 lb

## 2020-09-18 DIAGNOSIS — G40209 Localization-related (focal) (partial) symptomatic epilepsy and epileptic syndromes with complex partial seizures, not intractable, without status epilepticus: Secondary | ICD-10-CM | POA: Diagnosis not present

## 2020-09-18 DIAGNOSIS — Q030 Malformations of aqueduct of Sylvius: Secondary | ICD-10-CM

## 2020-09-18 MED ORDER — LEVETIRACETAM 1000 MG PO TABS: 1000.0000 mg | ORAL_TABLET | Freq: Two times a day (BID) | ORAL | 6 refills | Status: DC

## 2020-09-18 NOTE — Procedures (Signed)
ELECTROENCEPHALOGRAM REPORT  Date of Study: 09/10/2020  Patient's Name: Tracy Holmes MRN: [id number] Date of Birth: 07-01-1985  Referring Provider: Dr. Patrcia Dolly  Clinical History: This is a 35 year old man with recurrent nocturnal seizures, EEG for classification  Medications: Keppra  Technical Summary: A multichannel digital 1-hour EEG recording measured by the international 10-20 system with electrodes applied with paste and impedances below 5000 ohms performed in our laboratory with EKG monitoring in an awake and asleep patient.  Hyperventilation was not performed. Photic stimulation was performed.  The digital EEG was referentially recorded, reformatted, and digitally filtered in a variety of bipolar and referential montages for optimal display.    Description: The patient is awake and asleep during the recording.  During maximal wakefulness, there is a symmetric, medium voltage 10 Hz posterior dominant rhythm that attenuates with eye opening.  There is occasional focal 4-5 Hz theta slowing over the left temporal region, at times sharply contoured without clear epileptogenic potential. During drowsiness and sleep, there is an increase in theta slowing of the background.  Vertex waves and symmetric sleep spindles were seen. Photic stimulation did not elicit any abnormalities.  There were no epileptiform discharges or electrographic seizures seen.    EKG lead was unremarkable.  Impression: This 1-hour awake and asleep EEG is abnormal due to occasional focal slowing over the left temporal region.  Clinical Correlation of the above findings indicates focal cerebral dysfunction over the left temporal region suggestive of underlying structural or physiologic abnormality. The absence of epileptiform discharges does not exclude a clinical diagnosis of epilepsy. Clinical correlation is advised.   Patrcia Dolly, M.D.

## 2020-09-18 NOTE — Progress Notes (Signed)
NEUROLOGY FOLLOW UP OFFICE NOTE  Tracy Holmes [id number] 02/19/1986  HISTORY OF PRESENT ILLNESS: I had the pleasure of seeing Tracy Holmes in follow-up in the neurology clinic on 09/18/2020.  The patient was last seen over a year ago for nocturnal seizures. He is accompanied by his wife who helps supplement the history today.  Records and images were personally reviewed where available. His MRI brain done in 01/2019 showed lateral ventriculomegaly with ballooning, third ventricular enlargement with dilated inferior recesses projecting downward, highly stenotic appearance of lower cerebral aqueduct. No underlying mass. 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 There was trace periventricular FLAIR signal around the lateral ventricles presumably reactive to the hydrocephalus. Hippocampi symmetric with no abnormal cortical changes seen. 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 Since his last visit, he had a 1-hour EEG on 09/2020 which showed occasional left temporal focal slowing. He had called our office to report nocturnal seizures on 05/07/20, Levetiracetam dose increased to 500mg  BID, then 07/16/20 (dose increased to 750mg  BID). His last seizure was 08/31/20. He has not noticed a prodrome of a major headache before he goes to sleep and would tend to have a nocturnal seizure later on. With the last seizure, he again had a major headache before going to bed, woke up in the morning and his head was booming. He took a showed and could not eat so he went back to bed and had a seizure in his sleep and bit his tongue. His wife reports the convulsion last 2-3 minutes, then he is out of it for 5-10 minutes. She denies any staring/unresponsiveness,  denies any olfactory/gustatory hallucinations, focal numbness/tingling/weakness, myoclonic jerks. He has sinus headaches, no dizziness, vision changes, no falls. Mood is fine. He denies any sleep deprivation. He  drinks alcohol socially and has not noticed this as a seizure trigger.   History on Initial Assessment 02/28/2019: This is a very pleasant 35 year old right-handed man with no significant past medical history presenting for evaluation of seizure-like activity last 02/04/2019. He is amnestic of the event, waking up to EMS in his house. His right arm felt sore but no focal weakness. He reports that his wife woke up because she heard him gasping for air. He was face down on the pillow, he was not responding, so she moved his head and found him with eyes open with a blank stare, repeatedly gasping for air. She was not sure if there was body jerking, she had reported shaking to EMS/ER, but husband states she is now unsure. He bit the right side of his tongue, no incontinence. He was brought to [facility] where bloodwork showed a K of 2.9, otherwise unremarkable. I personally reviewed MRI brain with and without contrast which did not show any acute changes. There was lateral ventriculomegaly with ballooning, third ventricular enlargement with dilated inferior recesses projecting downward, highly stenotic appearance of lower cerebral aqueduct. No underlying mass. 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 There was trace periventricular FLAIR signal around the lateral ventricles presumably reactive to the hydrocephalus. Hippocampi symmetric with no abnormal cortical changes seen. 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 Due to abnormal brain scan, he was started on Levetiracetam 500mg  BID. He denies any side effects. He denies any prior history of seizures. He denies any staring/unresponsive episodes, gaps in time, olfactory/gustatory hallucinations, deja vu, rising epigastric sensation, focal numbness/tingling/weakness, myoclonic jerks. He denies any headaches,  dizziness, vision changes, neck pain, bowel/bladder dysfunction. He  has scoliosis with a pinched nerve and back pain, which occurred a week prior to the seizure. He does not drink alcohol. He does not feel under stress, but has had a lot going on, waking up a few times with his mind racing. He was recently married with a new house and a baby on the way in June. 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  Epilepsy Risk Factors: His father had 1 or 2 seizures at age 68. Otherwise he had a normal birth and early development.  There is no history of febrile convulsions, CNS infections such as meningitis/encephalitis, significant traumatic brain injury, neurosurgical procedures.  PAST MEDICAL HISTORY: Past Medical History:  Diagnosis Date   Seizure [facility])     MEDICATIONS: Current Outpatient Medications on File Prior to Visit  Medication Sig Dispense Refill   levETIRAcetam (KEPPRA) 750 MG tablet Take 1 tablet (750 mg total) by mouth 2 (two) times daily. 180 tablet 3   No current facility-administered medications on file prior to visit.    ALLERGIES: No Known Allergies  FAMILY HISTORY: Family History  Problem Relation Age of Onset   Diabetes Mother    Heart disease Father    Breast cancer Maternal Grandmother     SOCIAL HISTORY: Social History   Socioeconomic History   Marital status: Married    Spouse name: Not on file   Number of children: 0   Years of education: Not on file   Highest education level: Not on file  Occupational History   Not on file  Tobacco Use   Smoking status: Never   Smokeless tobacco: Never  Vaping Use   Vaping Use: Never used  Substance and Sexual Activity   Alcohol use: Yes    Comment: social- rare   Drug use: Never   Sexual activity: Not on file  Other Topics Concern   Not on file  Social History Narrative   Right handed       Highest level of edu- some college      Lives with wife in apartment on third floor   Social Determinants of Health   Financial Resource Strain: Not on file  Food Insecurity: Not on  file  Transportation Needs: Not on file  Physical Activity: Not on file  Stress: Not on file  Social Connections: Not on file  Intimate Partner Violence: Not on file     PHYSICAL EXAM: Vitals:   09/18/20 1337  BP: (!) 171/13  Pulse: 73  SpO2: 98%   General: No acute distress Head:  Normocephalic/atraumatic Skin/Extremities: No rash, no edema Neurological Exam: alert and awake. No aphasia or dysarthria. Fund of knowledge is appropriate.  Recent and remote memory are intact.  Attention and concentration are normal.   Cranial nerves: Pupils equal, round. Extraocular movements intact with no nystagmus. Visual fields full.  No facial asymmetry.  Motor: Bulk and tone normal, muscle strength 5/5 throughout with no pronator drift.   Finger to nose testing intact.  Gait narrow-based and steady, able to tandem walk adequately.  Romberg negative.   IMPRESSION: This is a pleasant 35 yo RH man with no significant past medical history with recurrent nocturnal convulsions, last seizure was 08/31/20. He has now noticed a prodrome of a major headache before the seizure, otherwise he has not significant headaches on a regular basis. MRI brain in in 2020 had shown hydrocephalus with aqueductal stenosis, likely congenital, interval brain MRI with and without contrast will  be ordered. His EEG showed occasional left temporal focal slowing, no epileptiform discharges. Increase Levetiracetam to 1000mg  BID. He was given prn lorazepam 0.5mg  to take when he has a similar headache to hopefully abort another seizure. We again discussed [place] driving laws to stop driving until 6 months seizure-free. Follow-up in 3 months, call for any changes.     Thank you for allowing me to participate in his care.  Please do not hesitate to call for any questions or concerns.   , M.D.

## 2020-09-18 NOTE — Patient Instructions (Addendum)
Schedule MRI brain with and without contrast 2. Increase Keppra (Levetiracetam) to 1000mg : take 1 tablet twice a day 3. Take the lorazepam 0.5mg  as needed when he have the headache at night to hopefully prevent a convulsion from occurring 4. Follow-up in 3 months, call for any changes  A good resource online is the epilepsy foundation website: Epilepsy.com  Seizure Precautions: 1. If medication has been prescribed for you to prevent seizures, take it exactly as directed.  Do not stop taking the medicine without talking to your doctor first, even if you have not had a seizure in a long time.   2. Avoid activities in which a seizure would cause danger to yourself or to others.  Don't operate dangerous machinery, swim alone, or climb in high or dangerous places, such as on ladders, roofs, or girders.  Do not drive unless your doctor says you may.  3. If you have any warning that you may have a seizure, lay down in a safe place where you can't hurt yourself.    4.  No driving for 6 months from last seizure, as per [facility].   Please refer to the following link on the Epilepsy Foundation of America's website for more information: http://www.epilepsyfoundation.org/answerplace/Social/driving/drivingu.cfm   5.  Maintain good sleep hygiene. Avoid alcohol.  6.  Contact your doctor if you have any problems that may be related to the medicine you are taking.  7.  Call 911 and bring the patient back to the ED if:        A.  The seizure lasts longer than 5 minutes.       B.  The patient doesn't awaken shortly after the seizure  C.  The patient has new problems such as difficulty seeing, speaking or moving  D.  The patient was injured during the seizure  E.  The patient has a temperature over 102 F (39C)  F.  The patient vomited and now is having trouble breathing

## 2020-10-19 ENCOUNTER — Telehealth: Payer: Self-pay

## 2020-10-19 NOTE — Telephone Encounter (Signed)
Pt called no answer left a voice mail to call the office back when he calls back we need the pre certification phone number office the back of his insurance card,

## 2020-10-20 NOTE — Telephone Encounter (Signed)
Pt called back and left the number heather asked about [phone]

## 2020-10-27 ENCOUNTER — Ambulatory Visit: Payer: BC Managed Care – PPO

## 2020-11-04 ENCOUNTER — Ambulatory Visit: Admission: RE | Admit: 2020-11-04 | Payer: BC Managed Care – PPO | Source: Ambulatory Visit

## 2020-12-25 ENCOUNTER — Ambulatory Visit: Payer: BC Managed Care – PPO | Admitting: Neurology

## 2021-01-05 ENCOUNTER — Ambulatory Visit: Payer: BC Managed Care – PPO | Admitting: Neurology

## 2021-02-06 ENCOUNTER — Emergency Department
Admission: EM | Admit: 2021-02-06 | Discharge: 2021-02-06 | Discharge status: Against Medical Advice | Payer: BC Managed Care – PPO

## 2021-02-08 ENCOUNTER — Telehealth: Payer: Self-pay | Admitting: Neurology

## 2021-02-08 NOTE — Telephone Encounter (Signed)
Yes keppra 100 bid, hospital asked patient to go up to 1250 bid, he has not done so. Please advise.

## 2021-02-08 NOTE — Telephone Encounter (Signed)
Pls have him increase to 1500mg  BID, pls send Rx for Keppra 1000mg : Take 1 and 1/2 tabs BID and let him know, thanks

## 2021-02-08 NOTE — Telephone Encounter (Signed)
Pls check if any seizure triggers, is he on the Keppra 1000mg  BID? Thanks

## 2021-02-08 NOTE — Telephone Encounter (Signed)
Patient advised, he has enough at home to work with. He wants to hold on new rx at this time to be sent in.

## 2021-02-26 ENCOUNTER — Ambulatory Visit: Payer: BC Managed Care – PPO | Admitting: Neurology

## 2021-03-09 ENCOUNTER — Telehealth: Payer: Self-pay | Admitting: Neurology

## 2021-03-09 NOTE — Telephone Encounter (Signed)
Please let pt know that seizures occurred in the setting of missing medications, alcohol, and sleep deprivation.  Continue levitiracetam 1500mg  BID as prescribed.  Follow-up as scheduled with Dr.Aquino.
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
## 2021-03-09 NOTE — Telephone Encounter (Signed)
Spoke with pt wife per Dr Allena Katz pt was advised  that seizures occurred in the setting of missing medications, alcohol, and sleep deprivation.  Continue levitiracetam 1500mg  BID as prescribed.  Follow-up as scheduled with Dr.Aquino

## 2021-03-09 NOTE — Telephone Encounter (Signed)
Pt's wife called in stating they need something in writing stating he cannot drive for 6 months so they can take it to his employer.
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
## 2021-03-18 NOTE — Telephone Encounter (Signed)
Ok to write letter stating:  Tracy Holmes is under our care in the Neurology clinic for seizures. He reported a seizure on March 06, 2021. As per [place] driving laws, he is restricted from driving until 6 months seizure-free. For any questions or concerns, please do not hesitate to contact our office.   Heather, pls make sure he has refills for increased dose of Keppra, and to remind him to avoid seizure triggers. Thanks!!

## 2021-03-18 NOTE — Telephone Encounter (Signed)
Pls see below, thanks!

## 2021-03-19 MED ORDER — LEVETIRACETAM 1000 MG PO TABS: ORAL_TABLET | ORAL | 1 refills | Status: DC

## 2021-03-19 NOTE — Addendum Note (Signed)
Addended by: Dimas Chyle on: 03/19/2021 08:03 AM   Modules accepted: Orders

## 2021-03-19 NOTE — Telephone Encounter (Signed)
Pt called no answer left a voice mail that letter is at the front desk for pick up

## 2021-03-23 ENCOUNTER — Other Ambulatory Visit: Payer: Self-pay

## 2021-03-31 ENCOUNTER — Telehealth: Payer: Self-pay | Admitting: Neurology

## 2021-03-31 NOTE — Telephone Encounter (Signed)
Spoke to Dr. 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 Discussed that his prior MRI in 2020 had shown Brain: Lateral ventriculomegaly with ballooning. Third ventricular enlargement with dilated inferior recesses that project downward. The fourth ventricle is normal caliber and there is a highly  stenotic appearance of the lower cerebral aqua duct. 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 We will order on his f/u tomorrow. May consider LP as well for opening pressure if no change in brain MRI.

## 2021-04-01 ENCOUNTER — Other Ambulatory Visit: Payer: Self-pay

## 2021-04-01 ENCOUNTER — Encounter: Payer: Self-pay | Admitting: Neurology

## 2021-04-01 ENCOUNTER — Ambulatory Visit (INDEPENDENT_AMBULATORY_CARE_PROVIDER_SITE_OTHER): Payer: BC Managed Care – PPO | Admitting: Neurology

## 2021-04-01 VITALS — BP 155/116 | HR 75 | Ht 70.0 in | Wt 307.6 lb

## 2021-04-01 DIAGNOSIS — Q030 Malformations of aqueduct of Sylvius: Secondary | ICD-10-CM | POA: Diagnosis not present

## 2021-04-01 DIAGNOSIS — G40209 Localization-related (focal) (partial) symptomatic epilepsy and epileptic syndromes with complex partial seizures, not intractable, without status epilepticus: Secondary | ICD-10-CM | POA: Diagnosis not present

## 2021-04-01 DIAGNOSIS — H4710 Unspecified papilledema: Secondary | ICD-10-CM | POA: Diagnosis not present

## 2021-04-01 MED ORDER — LEVETIRACETAM 1000 MG PO TABS: ORAL_TABLET | ORAL | 3 refills | Status: AC
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
## 2021-04-10 ENCOUNTER — Encounter: Payer: Self-pay | Admitting: Neurology

## 2021-04-15 ENCOUNTER — Telehealth: Payer: Self-pay | Admitting: Neurology

## 2021-04-15 DIAGNOSIS — G40209 Localization-related (focal) (partial) symptomatic epilepsy and epileptic syndromes with complex partial seizures, not intractable, without status epilepticus: Secondary | ICD-10-CM

## 2021-04-15 DIAGNOSIS — R569 Unspecified convulsions: Secondary | ICD-10-CM

## 2021-04-15 NOTE — Telephone Encounter (Signed)
Spoke with pt he would like to move MRI from [employer] to [facility] he is going to call to see if they will do MRI of brain and call back, pt transferred to the front to [employer],

## 2021-04-15 NOTE — Telephone Encounter (Signed)
Patients is calling about his MRI appt. Tracy Holmes he has not heard from anyone. He is also wondering if Tracy Holmes received a questionnaire from his job HR

## 2021-04-20 ENCOUNTER — Ambulatory Visit: Payer: BC Managed Care – PPO | Admitting: Neurology

## 2021-04-26 ENCOUNTER — Ambulatory Visit: Payer: BC Managed Care – PPO | Admitting: Neurology

## 2021-05-01 ENCOUNTER — Other Ambulatory Visit: Payer: Commercial Managed Care - PPO

## 2021-05-07 ENCOUNTER — Telehealth: Payer: Self-pay | Admitting: Physician Assistant

## 2021-05-07 NOTE — Telephone Encounter (Signed)
Spoke wit Cyril Mourning, [facility] at 4:50 pm. Radiology report on patient's MRI showssevere hydrocephalus and downward tonsils. Radiology recommends to see patient earlier than his scheduled visit with Dr. Delice Lesch.  Will notify front desk to arrange this visit

## 2021-05-10 ENCOUNTER — Telehealth: Payer: Self-pay | Admitting: Neurology

## 2021-05-10 NOTE — Telephone Encounter (Signed)
[name] called to see if we can get a disc of Tracy Holmes's MRI sent to [place].

## 2021-05-10 NOTE — Telephone Encounter (Signed)
Tried calling patient, no answer. Spoke to wife Corey Harold. Tracy Holmes has been doing much better, no seizures. He has not expressed anything about any nasal discharge. His ears have always been waxy. Recently he has been more sensitive to smells. No headaches, focal symptoms.  Discussed MRI report from [facility] in [facility], images unavailable for review, however the changes described are not seen on his brain MRI from 2020. Disc requested for review.   Report from 05/07/2021 indicates:  "T2/FLAIR signal hyperintensity with T1 hypointensity extending from the anterior aspect of the left frontal horn which appears to extend to the cortex of the anterior inferior left frontal lobe. This area of gliosis is adjacent to an abnormality in the left frontal sinus.   Masslike opacity in the left frontal sinus which appears contiguous with the left frontal lobe cortex. 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  Hydrocephalus with dilation of the lateral and third ventricles, fourth ventricle appears normal, no obstructive mass identified and no clear evidence of aqueduct stenosis.   Low-lying cerebellar tonsils measuring 41mm on the right and 35mm on the left, crowding at the foramen magnum.  Dilated optic nerve sheaths with mildly tortuous optic nerves, no clear evidence for posterior globe buckling at the optic nerve entry zone to suggest papilledema"   Findings discussed with wife. On review of notes, in November 2020, neurosurgeon Dr. Lucy Chris at [facility] reached out to patient because "patient cancelled appt and Dr. Adriana Simas said he needs to see him," patient did not reschedule. Discussed this with wife and recommendation for Neurosurgical evaluation, she will check if Dr. Adriana Simas is in network and contact his office for appt, or let us know if he is not in network.

## 2021-05-10 NOTE — Telephone Encounter (Signed)
Called [name] to get report she is going to refax it back to the nurse station fax number

## 2021-05-10 NOTE — Telephone Encounter (Signed)
[name] from [facility] left a message on VM with a call report. Patients MRI needs attention asap. Phone number is [phone]

## 2021-07-02 ENCOUNTER — Ambulatory Visit: Payer: Commercial Managed Care - PPO | Admitting: Neurology

## 2021-08-21 ENCOUNTER — Other Ambulatory Visit: Payer: Self-pay | Admitting: Neurology
